# Patient Record
Sex: Male | Born: 1980 | Race: White | Hispanic: No | Marital: Married | Smoking: Current every day smoker
Health system: Southern US, Community
[De-identification: ages and names within clinical notes are randomized; demographics above are authoritative.]

---

## 2016-12-05 ENCOUNTER — Emergency Department (HOSPITAL_BASED_OUTPATIENT_CLINIC_OR_DEPARTMENT_OTHER): Payer: Self-pay

## 2016-12-05 ENCOUNTER — Emergency Department (HOSPITAL_BASED_OUTPATIENT_CLINIC_OR_DEPARTMENT_OTHER)
Admission: EM | Admit: 2016-12-05 | Discharge: 2016-12-05 | Disposition: A | Discharge status: Home / Self Care | Payer: Self-pay | Attending: Emergency Medicine | Admitting: Emergency Medicine

## 2016-12-05 ENCOUNTER — Encounter (HOSPITAL_BASED_OUTPATIENT_CLINIC_OR_DEPARTMENT_OTHER): Payer: Self-pay | Admitting: Emergency Medicine

## 2016-12-05 DIAGNOSIS — Z79899 Other long term (current) drug therapy: Secondary | ICD-10-CM | POA: Insufficient documentation

## 2016-12-05 DIAGNOSIS — R079 Chest pain, unspecified: Secondary | ICD-10-CM

## 2016-12-05 DIAGNOSIS — R0789 Other chest pain: Secondary | ICD-10-CM | POA: Insufficient documentation

## 2016-12-05 DIAGNOSIS — F17210 Nicotine dependence, cigarettes, uncomplicated: Secondary | ICD-10-CM | POA: Insufficient documentation

## 2016-12-05 DIAGNOSIS — M940 Chondrocostal junction syndrome [Tietze]: Secondary | ICD-10-CM | POA: Insufficient documentation

## 2016-12-05 DIAGNOSIS — J029 Acute pharyngitis, unspecified: Secondary | ICD-10-CM | POA: Insufficient documentation

## 2016-12-05 LAB — COMPREHENSIVE METABOLIC PANEL
ALK PHOS: 57 U/L (ref 38–126)
ALT: 23 U/L (ref 17–63)
AST: 24 U/L (ref 15–41)
Albumin: 4.8 g/dL (ref 3.5–5.0)
Anion gap: 12 (ref 5–15)
BILIRUBIN TOTAL: 0.7 mg/dL (ref 0.3–1.2)
BUN: 14 mg/dL (ref 6–20)
CALCIUM: 9.4 mg/dL (ref 8.9–10.3)
CO2: 24 mmol/L (ref 22–32)
CREATININE: 0.9 mg/dL (ref 0.61–1.24)
Chloride: 101 mmol/L (ref 101–111)
GFR calc non Af Amer: >60 mL/min (ref >60)
Glucose, Bld: 94 mg/dL (ref 65–99)
Potassium: 3.5 mmol/L (ref 3.5–5.1)
SODIUM: 137 mmol/L (ref 135–145)
Total Protein: 8.1 g/dL (ref 6.5–8.1)

## 2016-12-05 LAB — CBC WITH DIFFERENTIAL/PLATELET
Basophils Absolute: 0 10*3/uL (ref 0.0–0.1)
Basophils Relative: 0 %
EOS ABS: 0.2 10*3/uL (ref 0.0–0.7)
Eosinophils Relative: 2 %
HEMATOCRIT: 40.6 % (ref 39.0–52.0)
HEMOGLOBIN: 14.1 g/dL (ref 13.0–17.0)
LYMPHS ABS: 2.8 10*3/uL (ref 0.7–4.0)
Lymphocytes Relative: 28 %
MCH: 31.1 pg (ref 26.0–34.0)
MCHC: 34.7 g/dL (ref 30.0–36.0)
MCV: 89.4 fL (ref 78.0–100.0)
Monocytes Absolute: 0.9 10*3/uL (ref 0.1–1.0)
Monocytes Relative: 9 %
NEUTROS ABS: 6.1 10*3/uL (ref 1.7–7.7)
NEUTROS PCT: 61 %
Platelets: 208 10*3/uL (ref 150–400)
RBC: 4.54 MIL/uL (ref 4.22–5.81)
RDW: 13.4 % (ref 11.5–15.5)
WBC: 10 10*3/uL (ref 4.0–10.5)

## 2016-12-05 LAB — RAPID STREP SCREEN (MED CTR MEBANE ONLY): Streptococcus, Group A Screen (Direct): NEGATIVE

## 2016-12-05 LAB — MONONUCLEOSIS SCREEN: Mono Screen: NEGATIVE

## 2016-12-05 LAB — TROPONIN I: Troponin I: <0.03 ng/mL (ref <0.03)

## 2016-12-05 MED ORDER — RANITIDINE HCL 150 MG PO CAPS: 150.0000 mg | ORAL_CAPSULE | Freq: Two times a day (BID) | ORAL | 0 refills | Status: AC

## 2016-12-05 MED ORDER — GI COCKTAIL ~~LOC~~
30.0000 mL | Freq: Once | ORAL | Status: AC
Start: 2016-12-05 — End: 2016-12-05
  Administered 2016-12-05: 30 mL via ORAL
  Filled 2016-12-05: qty 30

## 2016-12-05 NOTE — ED Provider Notes (Signed)
[facility] Provider Note   CSN: [id number] Arrival date & time: 12/05/16  1839  By signing my name below, I, Rosana Fretana Waskiewicz, attest that this documentation has been prepared under the direction and in the presence of Alvira MondaySchlossman, Bodhi Stenglein, MD. Electronically Signed: Rosana Fretana Waskiewicz, ED Scribe. 12/05/16. 7:26 PM.  History   Chief Complaint Chief Complaint  Patient presents with  . Cough   The history is provided by the patient. No language interpreter was used.   HPI Comments: Alveria ApleyKyle Lankford is a [age] y.o. male who presents to the Emergency Department complaining of 7/10, centralized CP onset last night. Pt describes pain as a pressure sensation. Pt states pain is exacerbated by deep breathing, drinking fluids, sneezing, and coughing Pt reports associated sore throat and diaphoresis. Pt endorses tobacco use and occasional alcohol use. No Fhx of sudden cardiac death. No h/o PE/DVT, recent long travel, surgery, or prolonged immobilization. Pt denies nasal congestion, cough, nausea, vomiting, leg swelling, fever, chills or any other complaints at this time.  History reviewed. No pertinent past medical history.  There are no active problems to display for this patient.   History reviewed. No pertinent surgical history.     Home Medications    Prior to Admission medications   Medication Sig Start Date End Date Taking? Authorizing Provider  ranitidine (ZANTAC) 150 MG capsule Take 1 capsule (150 mg total) by mouth 2 (two) times daily. 12/05/16 12/12/16  Alvira MondaySchlossman, Adi Doro, MD    Family History History reviewed. No pertinent family history.  Social History Social History  Substance Use Topics  . Smoking status: Current Every Day Smoker  . Smokeless tobacco: Never Used  . Alcohol use Yes     Comment: occ     Allergies   Patient has no known allergies.   Review of Systems Review of Systems  Constitutional: Positive for diaphoresis. Negative for chills and fever.  HENT:  Positive for sore throat. Negative for congestion.   Eyes: Negative for visual disturbance.  Respiratory: Negative for cough and shortness of breath.   Cardiovascular: Positive for chest pain. Negative for leg swelling.  Gastrointestinal: Negative for abdominal pain, nausea and vomiting.  Genitourinary: Negative for difficulty urinating.  Musculoskeletal: Positive for myalgias. Negative for back pain and neck stiffness.  Skin: Negative for rash.  Neurological: Negative for syncope and headaches.     Physical Exam Updated Vital Signs BP (!) 167/109 (BP Location: Left Arm)   Pulse 74   Temp 98.3 F (36.8 C) (Oral)   Resp 18   Ht 6' (1.829 m)   Wt 87.5 kg (193 lb)   SpO2 100%   BMI 26.18 kg/m   Physical Exam  Constitutional: He is oriented to person, place, and time. He appears well-developed and well-nourished. No distress.  HENT:  Head: Normocephalic and atraumatic.  Mouth/Throat: Oropharyngeal exudate present.  White exudates to the right side of the posterior pharynx.   Eyes: Conjunctivae and EOM are normal.  Neck: Normal range of motion.  Bilateral submandibular lymphadenopathy.  Cardiovascular: Normal rate, regular rhythm, normal heart sounds and intact distal pulses.  Exam reveals no gallop and no friction rub.   No murmur heard. Pulmonary/Chest: Effort normal and breath sounds normal. No respiratory distress. He has no wheezes. He has no rales. He exhibits tenderness.  Abdominal: Soft. He exhibits no distension. There is no tenderness. There is no guarding.  Musculoskeletal: He exhibits no edema.  Neurological: He is alert and oriented to person, place, and time.  Skin:  Skin is warm and dry. He is not diaphoretic.  Nursing note and vitals reviewed.    ED Treatments / Results  DIAGNOSTIC STUDIES: Oxygen Saturation is 100% on RA, normal by my interpretation.   COORDINATION OF CARE: 7:21 PM-Discussed next steps with pt including a GI cocktail and strep test. Pt  verbalized understanding and is agreeable with the plan.   Labs (all labs ordered are listed, but only abnormal results are displayed) Labs Reviewed  RAPID STREP SCREEN (NOT AT [facility])  CULTURE, GROUP A STREP [facility])  MONONUCLEOSIS SCREEN  CBC WITH DIFFERENTIAL/PLATELET  COMPREHENSIVE METABOLIC PANEL  TROPONIN I    EKG  EKG Interpretation  Date/Time:  Wednesday December 05 2016 18:44:59 EDT Ventricular Rate:  66 PR Interval:  160 QRS Duration: 114 QT Interval:  398 QTC Calculation: 417 R Axis:   70 Text Interpretation:  Normal sinus rhythm TW abnormality nonspecific lead III and aVF No previous ECGs available Confirmed by Alvira Monday ([id number]) on 12/05/2016 6:57:15 PM Also confirmed by Alvira Monday ([id number]), editor Elita Quick ([id number])  on 12/06/2016 8:06:41 AM       Radiology Dg Chest 2 View  Result Date: 12/05/2016 CLINICAL DATA:  Acute onset of central chest pain. Initial encounter. EXAM: CHEST  2 VIEW COMPARISON:  None. FINDINGS: The lungs are well-aerated and clear. There is no evidence of focal opacification, pleural effusion or pneumothorax. The heart is normal in size; the mediastinal contour is within normal limits. No acute osseous abnormalities are seen. IMPRESSION: No acute cardiopulmonary process seen. Electronically Signed   By: Roanna Raider M.D.   On: 12/05/2016 19:06    Procedures Procedures (including critical care time)  Medications Ordered in ED Medications  gi cocktail (Maalox,Lidocaine,Donnatal) (30 mLs Oral Given 12/05/16 1930)     Initial Impression / Assessment and Plan / ED Course  I have reviewed the triage vital signs and the nursing notes.  Pertinent labs & imaging results that were available during my care of the patient were reviewed by me and considered in my medical decision making (see chart for details).    36yo male presents with chest pain and sore throat.  No sign of PTA/RPA/epiglottitis. ECG with nonspecific changes, no  prior. Troponin negative and doubt ACS given duration of symptoms. Doubt PE given no dyspnea, PERC negative. CXR without signs of pneumonia.  Strep and Mono spot negative.  Likely other viral pharyngitis. Pt with costochondral tenderness, suspect costochondritis in setting of viral illness. Recommend close PCP follow up. Patient discharged in stable condition with understanding of reasons to return.    Final Clinical Impressions(s) / ED Diagnoses   Final diagnoses:  Viral pharyngitis  Costochondritis  Chest pain, unspecified type    New Prescriptions Discharge Medication List as of 12/05/2016  8:09 PM    START taking these medications   Details  ranitidine (ZANTAC) 150 MG capsule Take 1 capsule (150 mg total) by mouth 2 (two) times daily., Starting Wed 12/05/2016, Until Wed 12/12/2016, Print       I personally performed the services described in this documentation, which was scribed in my presence. The recorded information has been reviewed and is accurate.     Alvira Monday, MD 12/06/16 1242

## 2016-12-05 NOTE — ED Triage Notes (Signed)
Patient states that he has had intermittent chest pain x 2 days. Worse with coughing, swallowing or movement. Patient reports that he also has a sore throat.

## 2016-12-08 LAB — CULTURE, GROUP A STREP (THRC)

## 2018-10-14 IMAGING — DX DG CHEST 2V
2 series · 2 of 2 positions shown · non-contrast
Comparison: None.

CLINICAL DATA: Acute onset of central chest pain. Initial
encounter.

EXAM:
CHEST  2 VIEW

[chest pa]
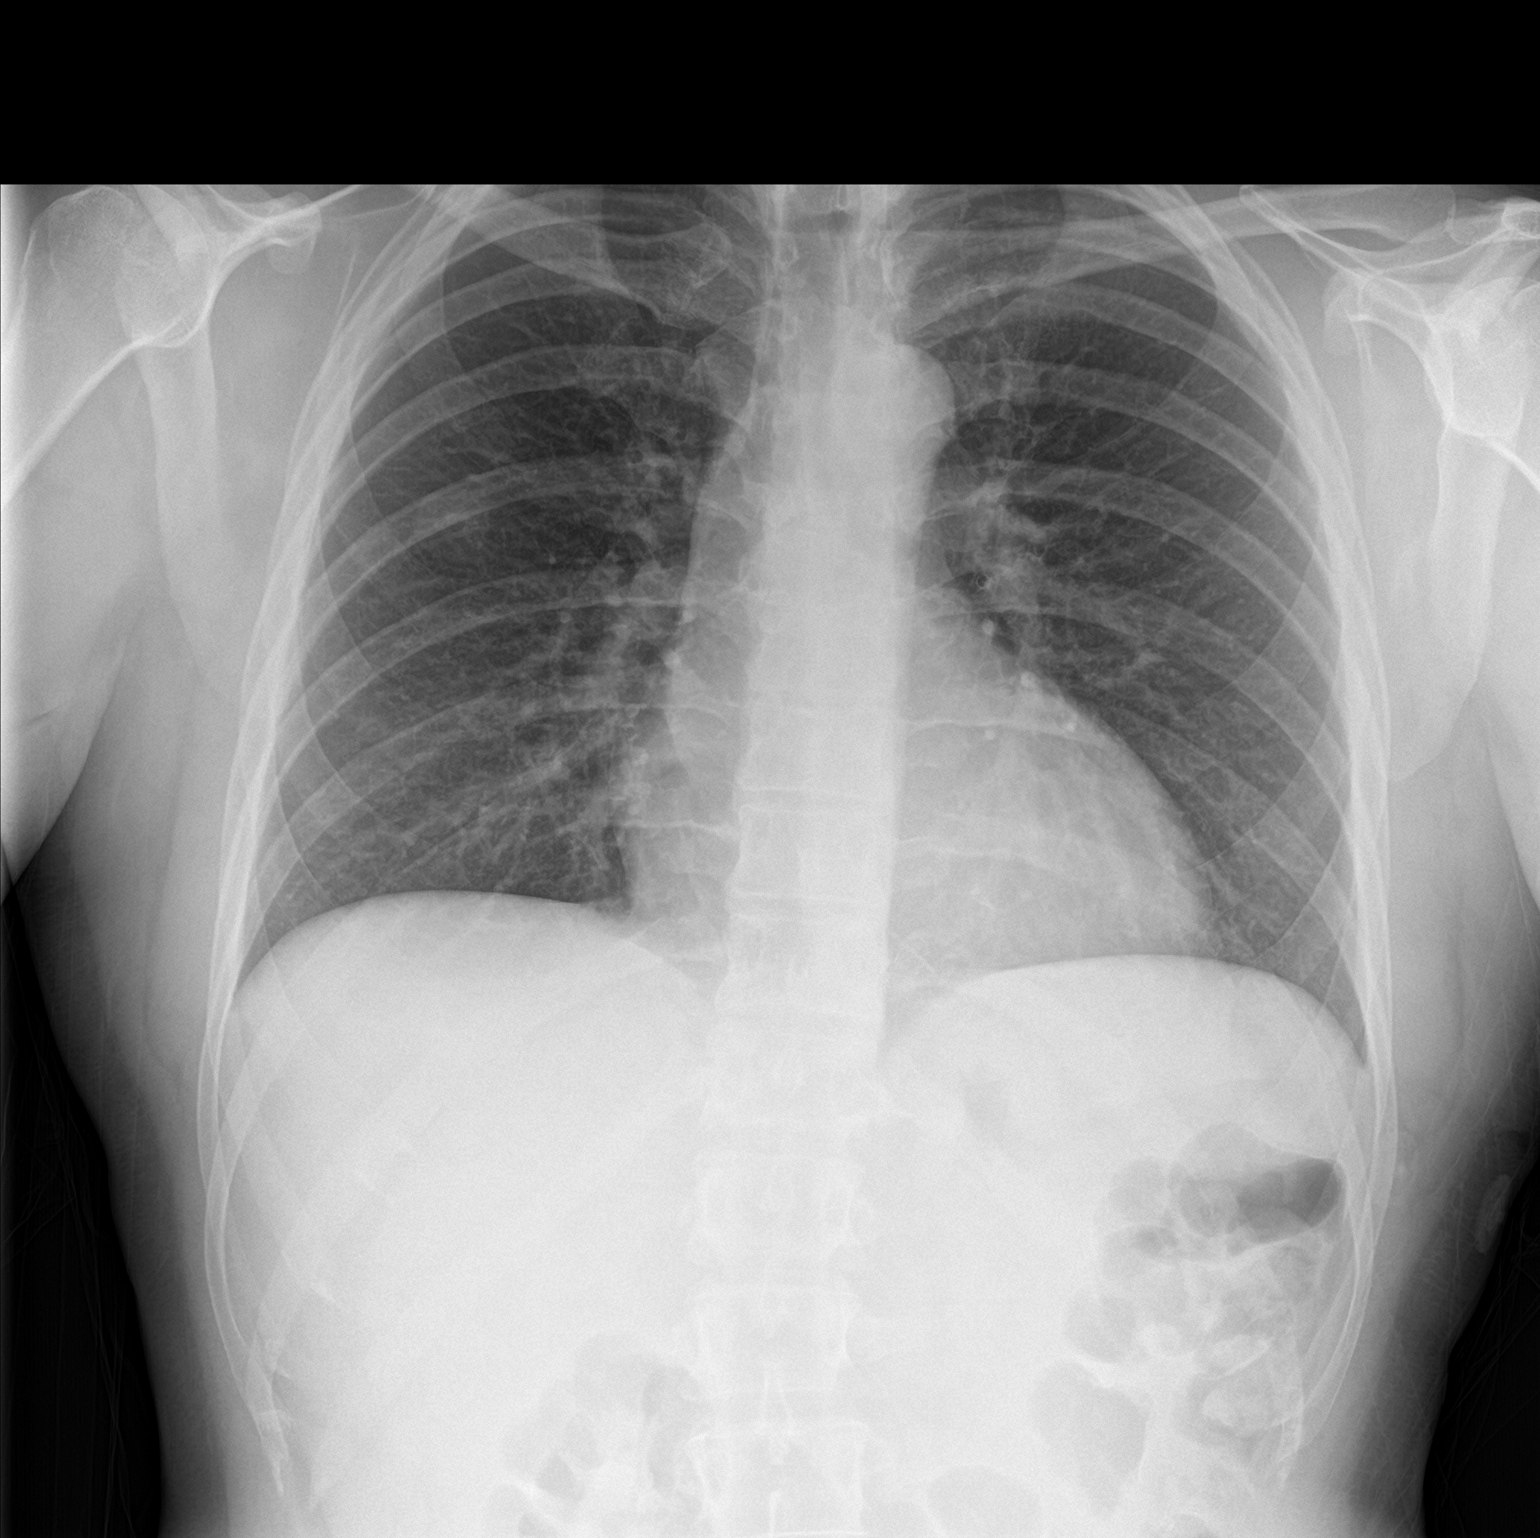

[chest lat]
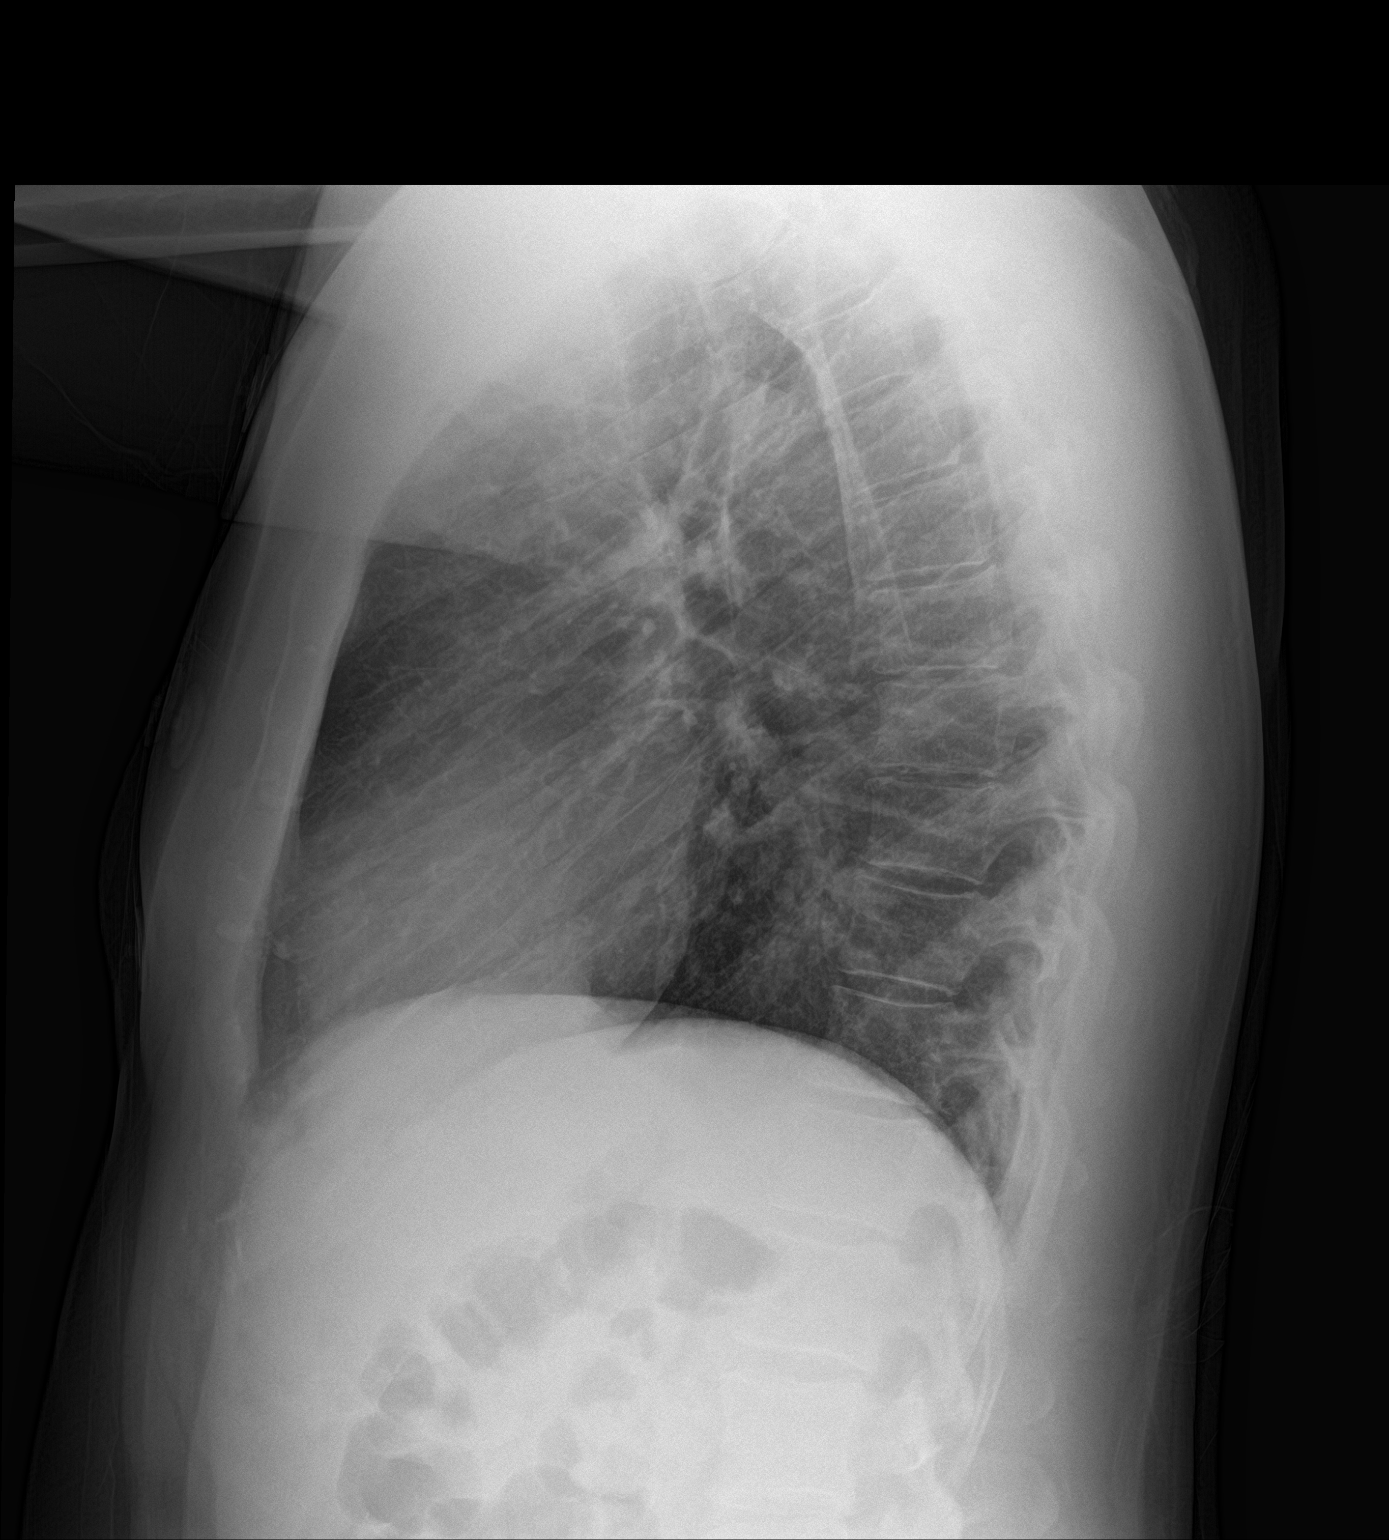

[2 of 2 positions shown; findings below may reference images not displayed]

FINDINGS: The lungs are well-aerated and clear. There is no evidence of focal
opacification, pleural effusion or pneumothorax.

The heart is normal in size; the mediastinal contour is within
normal limits. No acute osseous abnormalities are seen.
IMPRESSION: No acute cardiopulmonary process seen.
